# Patient Record
Sex: Male | Born: 2000 | Race: White | Hispanic: No | Marital: Single | Smoking: Never smoker
Health system: Southern US, Community
[De-identification: ages and names within clinical notes are randomized; demographics above are authoritative.]

---
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
## 2022-01-14 ENCOUNTER — Encounter (HOSPITAL_COMMUNITY): Payer: Self-pay

## 2022-01-14 ENCOUNTER — Ambulatory Visit (HOSPITAL_COMMUNITY)
Admission: EM | Admit: 2022-01-14 | Discharge: 2022-01-14 | Disposition: A | Discharge status: Home / Self Care | Payer: Self-pay | Attending: Family Medicine | Admitting: Family Medicine

## 2022-01-14 ENCOUNTER — Ambulatory Visit (INDEPENDENT_AMBULATORY_CARE_PROVIDER_SITE_OTHER): Payer: Self-pay

## 2022-01-14 DIAGNOSIS — R071 Chest pain on breathing: Secondary | ICD-10-CM

## 2022-01-14 DIAGNOSIS — R051 Acute cough: Secondary | ICD-10-CM

## 2022-01-14 DIAGNOSIS — R079 Chest pain, unspecified: Secondary | ICD-10-CM

## 2022-01-14 MED ORDER — PROMETHAZINE-DM 6.25-15 MG/5ML PO SYRP: 5.0000 mL | ORAL_SOLUTION | Freq: Four times a day (QID) | ORAL | 0 refills | Status: AC | PRN

## 2022-01-14 NOTE — ED Triage Notes (Signed)
Pt states he is here for chest pain when taking a deep breath on the right side . Pt states when he lays down he feels the pain more in the chest  x2days

## 2022-01-15 NOTE — ED Provider Notes (Signed)
[facility]   [id number] 01/14/22 Arrival Time: 1700  ASSESSMENT & PLAN:  1. Chest pain on breathing   2. Acute cough    I have personally viewed the imaging studies ordered this visit. Normal CXR. No pneumothorax.  Pain is worse with deep inspiration and palpation over anterior chest. Suspect MSK etiology from coughing. Very low suspicion of PE.  Begin: Meds ordered this encounter  Medications   promethazine-dextromethorphan (PROMETHAZINE-DM) 6.25-15 MG/5ML syrup    Sig: Take 5 mLs by mouth 4 (four) times daily as needed for cough.    Dispense:  118 mL    Refill:  0    Chest pain precautions given. Reviewed expectations re: course of current medical issues. Questions answered. Outlined signs and symptoms indicating need for more acute intervention. Patient verbalized understanding. After Visit Summary given.   SUBJECTIVE:  History from: patient. Troy Wilson is a 21 y.o. male who presents with complaint of R sided CP, esp with dep breath and with certain movements. No specific SOB. Symptoms first noted 2 days ago; stable. Without assoc n/v/diaphoresis. No chest injury.  Social History   Tobacco Use  Smoking Status Never   Passive exposure: Never  Smokeless Tobacco Never  Denies recreational drug use.  OBJECTIVE:  Vitals:   01/14/22 1723  BP: 125/70  Pulse: (!) 113  Resp: 16  Temp: 99.5 F (37.5 C)  TempSrc: Oral  SpO2: 100%    Tachycardia noted; pt is very anxious and admits so.  General appearance: alert, oriented, no acute distress Eyes: PERRLA; EOMI; conjunctivae normal HENT: normocephalic; atraumatic Neck: supple with FROM Lungs: without labored respirations; speaks full sentences without difficulty; CTAB Heart: regular rate and rhythm without murmer Chest Wall: without tenderness to palpation Abdomen: soft, non-tender; no guarding or rebound tenderness Extremities: without edema; without calf swelling or tenderness; symmetrical  without gross deformities Skin: warm and dry; without rash or lesions Neuro: normal gait Psychological: alert and cooperative; normal mood and affect   Imaging: DG Chest 2 View  Result Date: 01/14/2022 CLINICAL DATA:  Chest pain EXAM: CHEST - 2 VIEW COMPARISON:  None Available. FINDINGS: The heart size and mediastinal contours are within normal limits. Both lungs are clear. The visualized skeletal structures are unremarkable. IMPRESSION: No active cardiopulmonary disease. Electronically Signed   By: Helyn Numbers M.D.   On: 01/14/2022 20:13     No Known Allergies  History reviewed. No pertinent past medical history. Social History   Socioeconomic History   Marital status: Single    Spouse name: Not on file   Number of children: Not on file   Years of education: Not on file   Highest education level: Not on file  Occupational History   Not on file  Tobacco Use   Smoking status: Never    Passive exposure: Never   Smokeless tobacco: Never  Vaping Use   Vaping Use: Not on file  Substance and Sexual Activity   Alcohol use: Never   Drug use: Not on file   Sexual activity: Yes  Other Topics Concern   Not on file  Social History Narrative   Not on file   Social Determinants of Health   Financial Resource Strain: Not on file  Food Insecurity: Not on file  Transportation Needs: Not on file  Physical Activity: Not on file  Stress: Not on file  Social Connections: Not on file  Intimate Partner Violence: Not on file   Family History  Problem Relation Age of Onset  Healthy Mother    Hypertension Father    History reviewed. No pertinent surgical history.    Mardella Layman, MD 01/15/22 1055
